# Patient Record
Sex: Female | Born: 1980 | Race: Black or African American | Hispanic: No | Marital: Single | Smoking: Former smoker
Health system: Southern US, Community
[De-identification: ages and names within clinical notes are randomized; demographics above are authoritative.]

## PROBLEM LIST (undated history)

## (undated) DIAGNOSIS — I10 Essential (primary) hypertension: Secondary | ICD-10-CM

---

## 2002-06-16 ENCOUNTER — Ambulatory Visit (HOSPITAL_COMMUNITY): Admission: RE | Admit: 2002-06-16 | Discharge: 2002-06-16 | Payer: Self-pay

## 2002-08-04 ENCOUNTER — Ambulatory Visit (HOSPITAL_COMMUNITY): Admission: RE | Admit: 2002-08-04 | Discharge: 2002-08-04 | Payer: Self-pay

## 2002-09-15 ENCOUNTER — Ambulatory Visit (HOSPITAL_COMMUNITY): Admission: RE | Admit: 2002-09-15 | Discharge: 2002-09-15 | Payer: Self-pay

## 2002-11-02 ENCOUNTER — Encounter (HOSPITAL_COMMUNITY): Admission: RE | Admit: 2002-11-02 | Discharge: 2002-11-02 | Payer: Self-pay

## 2002-11-11 ENCOUNTER — Encounter: Admission: RE | Admit: 2002-11-11 | Discharge: 2002-11-11 | Payer: Self-pay

## 2002-11-11 ENCOUNTER — Inpatient Hospital Stay (HOSPITAL_COMMUNITY): Admission: AD | Admit: 2002-11-11 | Discharge: 2002-11-14 | Payer: Self-pay

## 2002-11-18 ENCOUNTER — Encounter: Admission: RE | Admit: 2002-11-18 | Discharge: 2002-11-18 | Payer: Self-pay | Admitting: Family Medicine

## 2002-11-25 ENCOUNTER — Encounter: Admission: RE | Admit: 2002-11-25 | Discharge: 2002-11-25 | Payer: Self-pay | Admitting: Family Medicine

## 2002-11-30 ENCOUNTER — Inpatient Hospital Stay (HOSPITAL_COMMUNITY): Admission: AD | Admit: 2002-11-30 | Discharge: 2002-12-03 | Payer: Self-pay

## 2002-11-30 ENCOUNTER — Encounter: Admission: RE | Admit: 2002-11-30 | Discharge: 2002-11-30 | Payer: Self-pay

## 2004-11-18 ENCOUNTER — Emergency Department (HOSPITAL_COMMUNITY): Admission: EM | Admit: 2004-11-18 | Discharge: 2004-11-18 | Payer: Self-pay | Admitting: Emergency Medicine

## 2005-01-31 ENCOUNTER — Emergency Department (HOSPITAL_COMMUNITY): Admission: EM | Admit: 2005-01-31 | Discharge: 2005-01-31 | Payer: Self-pay | Admitting: Emergency Medicine

## 2005-07-17 ENCOUNTER — Emergency Department (HOSPITAL_COMMUNITY): Admission: EM | Admit: 2005-07-17 | Discharge: 2005-07-17 | Payer: Self-pay | Admitting: Emergency Medicine

## 2007-07-09 ENCOUNTER — Emergency Department (HOSPITAL_COMMUNITY): Admission: EM | Admit: 2007-07-09 | Discharge: 2007-07-09 | Payer: Self-pay | Admitting: Emergency Medicine

## 2007-10-23 ENCOUNTER — Emergency Department (HOSPITAL_COMMUNITY): Admission: EM | Admit: 2007-10-23 | Discharge: 2007-10-23 | Payer: Self-pay | Admitting: Emergency Medicine

## 2007-10-23 ENCOUNTER — Encounter: Admission: RE | Admit: 2007-10-23 | Discharge: 2007-10-23 | Payer: Self-pay | Admitting: Family Medicine

## 2010-07-29 ENCOUNTER — Encounter: Payer: Self-pay | Admitting: Family Medicine

## 2010-11-23 NOTE — Discharge Summary (Signed)
NAME:  Christie Rojas, Christie Rojas                         ACCOUNT NO.:  [id number]   MEDICAL RECORD NO.:  [id number]                   PATIENT TYPE:  INP   LOCATION:  9159                                 FACILITY:  [facility]   PHYSICIAN:  Georgina Peer, M.D.              DATE OF BIRTH:  06-Oct-1980   DATE OF ADMISSION:  11/11/2002  DATE OF DISCHARGE:  11/14/2002                                 DISCHARGE SUMMARY   ADMISSION DIAGNOSIS:  Hypertension with scotomata to rule out preeclampsia.   DISCHARGE DIAGNOSIS:  Pregnancy-induced hypertension stable on bedrest.  No  evidence of preeclampsia.   BRIEF HISTORY:  The patient is a 30 year old black female gravida 1, para 0.  She is 36 weeks, admitted with elevated blood pressures by Ronda Fairly. Galen Daft,  M.D.  She also had some scotomata.  The patient was brought in for an  evaluation, 24-hour urine, and assessment for preeclampsia.   HOSPITAL COURSE:  During the hospital course, she is placed on strict bed  rest and blood pressures were followed closely.  She is placed on magnesium  sulfate 2 grams an hour.  The patient had an ultrasound which showed 25th to  50th percentile with a normal amniotic fluid index and biophysical profiles  which were 8 out of 8 on both May 5th and May 7th.  Magnesium sulfate was  continued and a 24-hour urine was obtained.  The creatinine clearance was 77  mL per minute and the total protein was 37 mg per 24 hours.  She had no  edema.  Her diastolics were running 70s to 95 and on May 8th her magnesium  sulfate was stopped.  She had some contractions which were not all  noticeable.  Fetal heart rate was reactive.  She denies headache or blurred  vision.  Diastolics on May 8th were 90-100 on occasion but on May 9th were  all under 90.  She had no headache or blurred vision.  Her reflexes were  normal.  Her abdomen was soft and nontender.  She had no epigastric pain.  Her PIH labs were normal.   DISPOSITION:  She was  discharged home on bed rest to follow up with Ronda Fairly. Tuso, M.D. within 1-2 days.   DISCHARGE INSTRUCTIONS:  She was given strict instructions to follow bed  rest, to watch for headache, blurred vision, scotomata, epigastric pain,  decreased fetal movement, vaginal bleeding or labor symptoms.  She  understands these and acknowledge those.   DISCHARGE MEDICATIONS:  She was sent home on prenatal vitamins.                                               Georgina Peer, M.D.    JPN/MEDQ  D:  11/14/2002  T:  11/14/2002  Job:  [id number]   cc:   Ronda Fairly. Galen Daft, M.D.  [address], [address]  [place]  [place] [address]  Fax: [phone]   [facility]

## 2011-04-02 LAB — POCT I-STAT, CHEM 8
BUN: 15
Creatinine, Ser: 1
HCT: 42
Potassium: 3.7
Sodium: 136

## 2011-04-02 LAB — POCT CARDIAC MARKERS: Operator id: 294501

## 2011-10-03 ENCOUNTER — Emergency Department (INDEPENDENT_AMBULATORY_CARE_PROVIDER_SITE_OTHER): Payer: Self-pay

## 2011-10-03 ENCOUNTER — Encounter (HOSPITAL_COMMUNITY): Payer: Self-pay | Admitting: *Deleted

## 2011-10-03 ENCOUNTER — Emergency Department (INDEPENDENT_AMBULATORY_CARE_PROVIDER_SITE_OTHER)
Admission: EM | Admit: 2011-10-03 | Discharge: 2011-10-03 | Disposition: A | Discharge status: Home / Self Care | Payer: Self-pay | Source: Home / Self Care | Attending: Family Medicine | Admitting: Family Medicine

## 2011-10-03 DIAGNOSIS — J45909 Unspecified asthma, uncomplicated: Secondary | ICD-10-CM

## 2011-10-03 DIAGNOSIS — J45901 Unspecified asthma with (acute) exacerbation: Secondary | ICD-10-CM

## 2011-10-03 HISTORY — DX: Essential (primary) hypertension: I10

## 2011-10-03 MED ORDER — AZITHROMYCIN 250 MG PO TABS
ORAL_TABLET | ORAL | Status: AC
  Filled 2011-10-03: qty 2

## 2011-10-03 MED ORDER — ALBUTEROL SULFATE (5 MG/ML) 0.5% IN NEBU
INHALATION_SOLUTION | RESPIRATORY_TRACT | Status: AC
  Filled 2011-10-03: qty 1

## 2011-10-03 MED ORDER — ALBUTEROL SULFATE HFA 108 (90 BASE) MCG/ACT IN AERS: 2.0000 | INHALATION_SPRAY | Freq: Four times a day (QID) | RESPIRATORY_TRACT | Status: DC | PRN

## 2011-10-03 MED ORDER — IPRATROPIUM BROMIDE 0.02 % IN SOLN
0.5000 mg | Freq: Once | RESPIRATORY_TRACT | Status: AC
  Administered 2011-10-03: 0.5 mg via RESPIRATORY_TRACT

## 2011-10-03 MED ORDER — ALBUTEROL SULFATE (5 MG/ML) 0.5% IN NEBU
5.0000 mg | INHALATION_SOLUTION | Freq: Once | RESPIRATORY_TRACT | Status: AC
  Administered 2011-10-03: 5 mg via RESPIRATORY_TRACT

## 2011-10-03 MED ORDER — AZITHROMYCIN 250 MG PO TABS
500.0000 mg | ORAL_TABLET | Freq: Once | ORAL | Status: AC
  Administered 2011-10-03: 500 mg via ORAL

## 2011-10-03 MED ORDER — AZITHROMYCIN 250 MG PO TABS: ORAL_TABLET | ORAL | Status: AC

## 2011-10-03 MED ORDER — ACETAMINOPHEN 325 MG PO TABS
650.0000 mg | ORAL_TABLET | Freq: Once | ORAL | Status: AC
  Administered 2011-10-03: 650 mg via ORAL

## 2011-10-03 MED ORDER — ACETAMINOPHEN 325 MG PO TABS
ORAL_TABLET | ORAL | Status: AC
  Filled 2011-10-03: qty 2

## 2011-10-03 NOTE — ED Notes (Signed)
Pt  Reports  Symptoms  Of  fver  Cough   Congestion    And  Shortness of  Breath    X  2  Days   She  Is  A  Smoker    As well  sjhe  Also  Has  htn  And  Is  Non  Compliant          She  Is  Awake  And  Alert  In mod  Distress  She  Is  Masked  And  Is  In a  Private  Room

## 2011-10-03 NOTE — ED Provider Notes (Signed)
History     CSN: [id number]  Arrival date & time 10/03/11  1956   First MD Initiated Contact with Patient 10/03/11 2022      Chief Complaint  Patient presents with  . Fever    (Consider location/radiation/quality/duration/timing/severity/associated sxs/prior treatment) Patient is a 31 y.o. female presenting with fever. The history is provided by the patient.  Fever Primary symptoms of the febrile illness include fever, cough, wheezing and shortness of breath. Primary symptoms do not include nausea or vomiting. The current episode started 2 days ago. This is a new problem. The problem has been gradually worsening.  Risk factors: smoker.   Past Medical History  Diagnosis Date  . Hypertension     History reviewed. No pertinent past surgical history.  No family history on file.  History  Substance Use Topics  . Smoking status: Current Everyday Smoker  . Smokeless tobacco: Not on file  . Alcohol Use: Yes    OB History    Grav Para Term Preterm Abortions TAB SAB Ect Mult Living                  Review of Systems  Constitutional: Positive for fever.  HENT: Positive for congestion, rhinorrhea and postnasal drip. Negative for sore throat.   Respiratory: Positive for cough, shortness of breath and wheezing.   Gastrointestinal: Negative for nausea and vomiting.    Allergies  Review of patient's allergies indicates not on file.  Home Medications   Current Outpatient Rx  Name Route Sig Dispense Refill  . ALBUTEROL SULFATE HFA 108 (90 BASE) MCG/ACT IN AERS Inhalation Inhale 2 puffs into the lungs every 6 (six) hours as needed for wheezing. 1 Inhaler 0  . AZITHROMYCIN 250 MG PO TABS  Take as directed on pack, start on fri. 6 each 0    BP 198/112  Pulse 110  Temp(Src) 102.1 F (38.9 C) (Oral)  Resp 22  SpO2 95%  LMP 09/19/2011  Physical Exam  Nursing note and vitals reviewed. Constitutional: She is oriented to person, place, and time. She appears well-developed  and well-nourished.  HENT:  Head: Normocephalic.  Right Ear: External ear normal.  Left Ear: External ear normal.  Nose: Mucosal edema and rhinorrhea present.  Mouth/Throat: Oropharynx is clear and moist.  Eyes: Pupils are equal, round, and reactive to light.  Neck: Normal range of motion. Neck supple.  Cardiovascular: Normal rate, regular rhythm, normal heart sounds and intact distal pulses.   Pulmonary/Chest: Effort normal. She has wheezes. She has rales.  Lymphadenopathy:    She has no cervical adenopathy.  Neurological: She is alert and oriented to person, place, and time.  Skin: Skin is warm and dry.  Psychiatric: She has a normal mood and affect.    ED Course  Procedures (including critical care time)  Labs Reviewed - No data to display No results found.   1. Acute asthmatic bronchitis       MDM  X-rays reviewed and report per radiologist.         Linna Hoff, MD 10/03/11 2201

## 2011-10-03 NOTE — Discharge Instructions (Signed)
 Take all of medicine, drink lots of fluids, no more smoking, see your doctor if further problems

## 2013-09-15 ENCOUNTER — Emergency Department (HOSPITAL_COMMUNITY)
Admission: EM | Admit: 2013-09-15 | Discharge: 2013-09-15 | Discharge status: Against Medical Advice | Payer: Self-pay | Attending: Emergency Medicine | Admitting: Emergency Medicine

## 2013-09-15 ENCOUNTER — Encounter (HOSPITAL_COMMUNITY): Payer: Self-pay | Admitting: Emergency Medicine

## 2013-09-15 DIAGNOSIS — I10 Essential (primary) hypertension: Secondary | ICD-10-CM | POA: Insufficient documentation

## 2013-09-15 DIAGNOSIS — F17200 Nicotine dependence, unspecified, uncomplicated: Secondary | ICD-10-CM | POA: Insufficient documentation

## 2013-09-15 DIAGNOSIS — M25539 Pain in unspecified wrist: Secondary | ICD-10-CM | POA: Insufficient documentation

## 2013-09-15 NOTE — ED Notes (Signed)
Pt in c/o right wrist pain, unknown injury but states pain is worse with movement and that it feels swollen at times, pt also concerned that her BP has been elevated because she has been out of her medication x1 month

## 2014-11-29 ENCOUNTER — Encounter (HOSPITAL_COMMUNITY): Payer: Self-pay | Admitting: Emergency Medicine

## 2014-11-29 ENCOUNTER — Emergency Department (HOSPITAL_COMMUNITY)
Admission: EM | Admit: 2014-11-29 | Discharge: 2014-11-29 | Discharge status: Against Medical Advice | Payer: Self-pay | Attending: Emergency Medicine | Admitting: Emergency Medicine

## 2014-11-29 DIAGNOSIS — R109 Unspecified abdominal pain: Secondary | ICD-10-CM | POA: Insufficient documentation

## 2014-11-29 DIAGNOSIS — Z720 Tobacco use: Secondary | ICD-10-CM | POA: Insufficient documentation

## 2014-11-29 DIAGNOSIS — R112 Nausea with vomiting, unspecified: Secondary | ICD-10-CM | POA: Insufficient documentation

## 2014-11-29 DIAGNOSIS — I10 Essential (primary) hypertension: Secondary | ICD-10-CM | POA: Insufficient documentation

## 2014-11-29 LAB — COMPREHENSIVE METABOLIC PANEL
ALBUMIN: 4.4 g/dL (ref 3.5–5.0)
ALT: 14 U/L (ref 14–54)
ANION GAP: 13 (ref 5–15)
AST: 17 U/L (ref 15–41)
Alkaline Phosphatase: 54 U/L (ref 38–126)
BUN: 10 mg/dL (ref 6–20)
CALCIUM: 9 mg/dL (ref 8.9–10.3)
CHLORIDE: 106 mmol/L (ref 101–111)
CO2: 21 mmol/L — AB (ref 22–32)
CREATININE: 0.93 mg/dL (ref 0.44–1.00)
GFR calc Af Amer: >60 mL/min (ref >60)
GFR calc non Af Amer: >60 mL/min (ref >60)
GLUCOSE: 117 mg/dL — AB (ref 65–99)
POTASSIUM: 2.4 mmol/L — AB (ref 3.5–5.1)
SODIUM: 140 mmol/L (ref 135–145)
TOTAL PROTEIN: 7.3 g/dL (ref 6.5–8.1)
Total Bilirubin: 0.1 mg/dL — ABNORMAL LOW (ref 0.3–1.2)

## 2014-11-29 LAB — CBC WITH DIFFERENTIAL/PLATELET
BASOS ABS: 0 10*3/uL (ref 0.0–0.1)
BASOS PCT: 0 % (ref 0–1)
EOS ABS: 0.1 10*3/uL (ref 0.0–0.7)
Eosinophils Relative: 1 % (ref 0–5)
HEMATOCRIT: 35.8 % — AB (ref 36.0–46.0)
Hemoglobin: 11.9 g/dL — ABNORMAL LOW (ref 12.0–15.0)
LYMPHS ABS: 6.4 10*3/uL — AB (ref 0.7–4.0)
Lymphocytes Relative: 75 % — ABNORMAL HIGH (ref 12–46)
MCH: 28 pg (ref 26.0–34.0)
MCHC: 33.2 g/dL (ref 30.0–36.0)
MCV: 84.2 fL (ref 78.0–100.0)
MONO ABS: 0.3 10*3/uL (ref 0.1–1.0)
Monocytes Relative: 3 % (ref 3–12)
NEUTROS ABS: 1.8 10*3/uL (ref 1.7–7.7)
NEUTROS PCT: 21 % — AB (ref 43–77)
Platelets: 286 10*3/uL (ref 150–400)
RBC: 4.25 MIL/uL (ref 3.87–5.11)
RDW: 14.3 % (ref 11.5–15.5)
WBC: 8.6 10*3/uL (ref 4.0–10.5)

## 2014-11-29 LAB — LIPASE, BLOOD: LIPASE: 17 U/L — AB (ref 22–51)

## 2014-11-29 NOTE — ED Notes (Signed)
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  She is complainig of abdominal pain, nausea,  and  vomiting.  She rates her pain 10/10.  The patient does have a history of hypertension but she could not advise if she has taken her BP medication today.

## 2014-11-30 LAB — PATHOLOGIST SMEAR REVIEW

## 2015-09-29 ENCOUNTER — Emergency Department (HOSPITAL_COMMUNITY)
Admission: EM | Admit: 2015-09-29 | Discharge: 2015-09-29 | Disposition: A | Discharge status: Home / Self Care | Payer: Self-pay | Attending: Emergency Medicine | Admitting: Emergency Medicine

## 2015-09-29 ENCOUNTER — Emergency Department (HOSPITAL_COMMUNITY): Payer: Self-pay

## 2015-09-29 ENCOUNTER — Encounter (HOSPITAL_COMMUNITY): Payer: Self-pay | Admitting: *Deleted

## 2015-09-29 DIAGNOSIS — R202 Paresthesia of skin: Secondary | ICD-10-CM | POA: Insufficient documentation

## 2015-09-29 DIAGNOSIS — R51 Headache: Secondary | ICD-10-CM

## 2015-09-29 DIAGNOSIS — Z880 Allergy status to penicillin: Secondary | ICD-10-CM | POA: Insufficient documentation

## 2015-09-29 DIAGNOSIS — I10 Essential (primary) hypertension: Secondary | ICD-10-CM | POA: Insufficient documentation

## 2015-09-29 DIAGNOSIS — F17200 Nicotine dependence, unspecified, uncomplicated: Secondary | ICD-10-CM | POA: Insufficient documentation

## 2015-09-29 DIAGNOSIS — R519 Headache, unspecified: Secondary | ICD-10-CM

## 2015-09-29 DIAGNOSIS — J0100 Acute maxillary sinusitis, unspecified: Secondary | ICD-10-CM | POA: Insufficient documentation

## 2015-09-29 LAB — CBC
HCT: 37.3 % (ref 36.0–46.0)
HEMOGLOBIN: 12.1 g/dL (ref 12.0–15.0)
MCH: 26.8 pg (ref 26.0–34.0)
MCHC: 32.4 g/dL (ref 30.0–36.0)
MCV: 82.5 fL (ref 78.0–100.0)
Platelets: 330 10*3/uL (ref 150–400)
RBC: 4.52 MIL/uL (ref 3.87–5.11)
RDW: 15 % (ref 11.5–15.5)
WBC: 5.8 10*3/uL (ref 4.0–10.5)

## 2015-09-29 LAB — I-STAT TROPONIN, ED: Troponin i, poc: 0 ng/mL (ref 0.00–0.08)

## 2015-09-29 LAB — BASIC METABOLIC PANEL
ANION GAP: 10 (ref 5–15)
BUN: 10 mg/dL (ref 6–20)
CALCIUM: 9.6 mg/dL (ref 8.9–10.3)
CHLORIDE: 103 mmol/L (ref 101–111)
CO2: 25 mmol/L (ref 22–32)
Creatinine, Ser: 0.9 mg/dL (ref 0.44–1.00)
GFR calc non Af Amer: >60 mL/min (ref >60)
GLUCOSE: 77 mg/dL (ref 65–99)
Potassium: 3.6 mmol/L (ref 3.5–5.1)
Sodium: 138 mmol/L (ref 135–145)

## 2015-09-29 MED ORDER — ALBUTEROL SULFATE HFA 108 (90 BASE) MCG/ACT IN AERS
2.0000 | INHALATION_SPRAY | RESPIRATORY_TRACT | Status: DC | PRN
  Administered 2015-09-29: 2 via RESPIRATORY_TRACT
  Filled 2015-09-29: qty 6.7

## 2015-09-29 MED ORDER — AEROCHAMBER PLUS FLO-VU MEDIUM MISC
1.0000 | Freq: Once | Status: AC
  Administered 2015-09-29: 1
  Filled 2015-09-29: qty 1

## 2015-09-29 MED ORDER — HYDROCHLOROTHIAZIDE 25 MG PO TABS
25.0000 mg | ORAL_TABLET | Freq: Every day | ORAL | Status: DC
  Administered 2015-09-29: 25 mg via ORAL
  Filled 2015-09-29: qty 1

## 2015-09-29 MED ORDER — IBUPROFEN 800 MG PO TABS
800.0000 mg | ORAL_TABLET | Freq: Once | ORAL | Status: AC
  Administered 2015-09-29: 800 mg via ORAL
  Filled 2015-09-29: qty 1

## 2015-09-29 MED ORDER — DOXYCYCLINE HYCLATE 100 MG PO CAPS: 100.0000 mg | ORAL_CAPSULE | Freq: Two times a day (BID) | ORAL | Status: AC

## 2015-09-29 MED ORDER — HYDROCHLOROTHIAZIDE 12.5 MG PO TABS: 12.5000 mg | ORAL_TABLET | Freq: Every day | ORAL | Status: AC

## 2015-09-29 MED ORDER — DOXYCYCLINE HYCLATE 100 MG PO TABS
100.0000 mg | ORAL_TABLET | Freq: Once | ORAL | Status: AC
  Administered 2015-09-29: 100 mg via ORAL
  Filled 2015-09-29: qty 1

## 2015-09-29 NOTE — ED Notes (Signed)
Pt reports hx of htn, has been out of her meds for years. Pt is here today due to HTN and headache. Reports intermittent blurred vision and tingling to left fingers x 1 days. Hypertensive at triage.

## 2015-09-29 NOTE — Discharge Instructions (Signed)
1. Medications: doxycycline, HCTZ, usual home medications 2. Treatment: rest, drink plenty of fluids,  3. Follow Up: Please followup with your primary doctor in 7-10 days for discussion of your diagnoses and further evaluation after today's visit; if you do not have a primary care doctor use the resource guide provided to find one; Please return to the ER for worsening headache, persistent symptoms or other concerns

## 2015-09-29 NOTE — ED Notes (Signed)
PA at bedside.

## 2015-09-29 NOTE — ED Provider Notes (Signed)
CSN: [id number]     Arrival date & time 09/29/15  1439 History   First MD Initiated Contact with Patient 09/29/15 1735     Chief Complaint  Patient presents with  . Hypertension  . Headache     (Consider location/radiation/quality/duration/timing/severity/associated sxs/prior Treatment) The history is provided by the patient and medical records. No language interpreter was used.     Christie Rojas is a 35 y.o. female  with a hx of HTN (untreated in the last 2-3 years) presents to the Emergency Department complaining of gradual, persistent, progressively worsening headache at the bilateral temples onset yesterday at work.  Pt reports she was not undergoing any strenuous activity when it started.  Pt with associated bilateral upper arm paresthesias that comes in waves and intermittent blurred vision.  She reports she as previously taking Norvasc but does not know the dosage.  She reports that last night her BP was 204/144.  Patient also reports associated congestion, postnasal drip and frontal headaches for approximately 4 days. She denies fevers or chills, nausea or vomiting, neck pain, neck stiffness, chest pain, shortness of breath, swelling of her legs, syncope, dysuria, hematuria.  Past Medical History  Diagnosis Date  . Hypertension    History reviewed. No pertinent past surgical history. History reviewed. No pertinent family history. Social History  Substance Use Topics  . Smoking status: Current Every Day Smoker  . Smokeless tobacco: Never Used  . Alcohol Use: Yes   OB History    No data available     Review of Systems  Constitutional: Negative for fever, diaphoresis, appetite change, fatigue and unexpected weight change.  HENT: Positive for congestion, postnasal drip and sinus pressure. Negative for mouth sores.   Eyes: Negative for visual disturbance.  Respiratory: Negative for cough, chest tightness, shortness of breath and wheezing.   Cardiovascular: Negative for chest  pain.  Gastrointestinal: Negative for nausea, vomiting, abdominal pain, diarrhea and constipation.  Endocrine: Negative for polydipsia, polyphagia and polyuria.  Genitourinary: Negative for dysuria, urgency, frequency and hematuria.  Musculoskeletal: Negative for back pain and neck stiffness.  Skin: Negative for rash.  Allergic/Immunologic: Negative for immunocompromised state.  Neurological: Positive for headaches (frontal). Negative for syncope and light-headedness.  Hematological: Does not bruise/bleed easily.  Psychiatric/Behavioral: Negative for sleep disturbance. The patient is not nervous/anxious.       Allergies  Penicillins and Shellfish allergy  Home Medications   Prior to Admission medications   Medication Sig Start Date End Date Taking? Authorizing Provider  ibuprofen (ADVIL,MOTRIN) 200 MG tablet Take 400 mg by mouth every 6 (six) hours as needed for moderate pain.   Yes Historical Provider, MD  doxycycline (VIBRAMYCIN) 100 MG capsule Take 1 capsule (100 mg total) by mouth 2 (two) times daily. 09/29/15   Reyce Lubeck, PA-C  hydrochlorothiazide (HYDRODIURIL) 12.5 MG tablet Take 1 tablet (12.5 mg total) by mouth daily. 09/29/15   Maleiya Pergola, PA-C   BP 186/127 mmHg  Pulse 84  Temp(Src) 98.1 F (36.7 C) (Oral)  Resp 20  SpO2 100%  LMP 09/29/2015 Physical Exam  Constitutional: She is oriented to person, place, and time. She appears well-developed and well-nourished. No distress.  HENT:  Head: Normocephalic and atraumatic.  Right Ear: Tympanic membrane, external ear and ear canal normal.  Left Ear: Tympanic membrane, external ear and ear canal normal.  Nose: Nose normal. No epistaxis. Right sinus exhibits no maxillary sinus tenderness and no frontal sinus tenderness. Left sinus exhibits no maxillary sinus tenderness and no frontal  sinus tenderness.  Mouth/Throat: Uvula is midline, oropharynx is clear and moist and mucous membranes are normal. Mucous  membranes are not pale and not cyanotic. No oropharyngeal exudate, posterior oropharyngeal edema, posterior oropharyngeal erythema or tonsillar abscesses.  Eyes: Conjunctivae and EOM are normal. Pupils are equal, round, and reactive to light. No scleral icterus.  No horizontal, vertical or rotational nystagmus  Neck: Normal range of motion and full passive range of motion without pain. Neck supple.  Full active and passive ROM without pain No midline or paraspinal tenderness No nuchal rigidity or meningeal signs  Cardiovascular: Normal rate, regular rhythm, normal heart sounds and intact distal pulses.   Pulmonary/Chest: Effort normal. No stridor. No respiratory distress. She has wheezes. She has no rales.  Wheezes noted throughout  Abdominal: Soft. Bowel sounds are normal. There is no tenderness. There is no rebound and no guarding.  Musculoskeletal: Normal range of motion.  Lymphadenopathy:    She has no cervical adenopathy.  Neurological: She is alert and oriented to person, place, and time. She has normal reflexes. No cranial nerve deficit. She exhibits normal muscle tone. Coordination normal.  Mental Status:  Alert, oriented, thought content appropriate. Speech fluent without evidence of aphasia. Able to follow 2 step commands without difficulty.  Cranial Nerves:  II:  Peripheral visual fields grossly normal, pupils equal, round, reactive to light III,IV, VI: ptosis not present, extra-ocular motions intact bilaterally  V,VII: smile symmetric, facial light touch sensation equal VIII: hearing grossly normal bilaterally  IX,X: midline uvula rise  XI: bilateral shoulder shrug equal and strong XII: midline tongue extension  Motor:  5/5 in upper and lower extremities bilaterally including strong and equal grip strength and dorsiflexion/plantar flexion Sensory: Pinprick and light touch normal in all extremities.  Deep Tendon Reflexes: 2+ and symmetric  Cerebellar: normal finger-to-nose  with bilateral upper extremities Gait: normal gait and balance CV: distal pulses palpable throughout   Skin: Skin is warm and dry. No rash noted. She is not diaphoretic.  Psychiatric: She has a normal mood and affect. Her behavior is normal. Judgment and thought content normal.  Nursing note and vitals reviewed.   ED Course  Procedures (including critical care time) Labs Review Labs Reviewed  BASIC METABOLIC PANEL  CBC  I-STAT TROPOININ, ED    Imaging Review Ct Head Wo Contrast  09/29/2015  CLINICAL DATA:  Hypertensive emergency.  Elevated blood pressure. EXAM: CT HEAD WITHOUT CONTRAST TECHNIQUE: Contiguous axial images were obtained from the base of the skull through the vertex without intravenous contrast. COMPARISON:  07/09/2007. FINDINGS: No evidence for acute infarction, hemorrhage, mass lesion, hydrocephalus, or extra-axial fluid. No atrophy or white matter disease. Intact calvarium. Foamy secretions and slight layering fluid in the BILATERAL maxillary sinuses suggesting acuity. No mastoid fluid. IMPRESSION: No acute intracranial findings. No change from prior normal study. No findings are observed diagnostic for PRES. Suspected BILATERAL acute maxillary sinusitis. Electronically Signed   By: Elsie Stain M.D.   On: 09/29/2015 18:18   I have personally reviewed and evaluated these images and lab results as part of my medical decision-making.   EKG Interpretation   Date/Time:  Friday September 29 2015 18:45:56 EDT Ventricular Rate:  72 PR Interval:  145 QRS Duration: 77 QT Interval:  399 QTC Calculation: 437 R Axis:   78 Text Interpretation:  Sinus rhythm LVH by voltage Borderline T  abnormalities, diffuse leads Confirmed by Rubin Payor  MD, NATHAN [phone]) on  09/29/2015 7:46:03 PM      MDM  Final diagnoses:  Essential hypertension  Nonintractable headache, unspecified chronicity pattern, unspecified headache type  Acute maxillary sinusitis, recurrence not specified    Christie Rojas presents with hypertension, headache.  Vision with mild, generalized wheezing. She is a smoker. Albuterol given complete resolution. Labs are reassuring. Negative troponin. Head CT shows acute maxillary sinusitis but no evidence for acute infarction, hemorrhage, hydrocephalus or lesion.    Patient noted to be hypertensive in the emergency department.  No signs of hypertensive urgency.  Discussed with patient the need for close follow-up and management by their primary care physician. She was given hydrochlorothiazide here in the emergency department with a decrease in her blood pressure. We will discharge home with this medication until she can see her primary care physician.  Discharge, doxycycline for her acute sinusitis as she has a penicillin allergy. Headache, tingling sensation and blurred vision have resolved at this time.  She is a normal neurologic examination ambulate without difficulty urinating emergency department.     Dahlia ClientHannah Jamicheal Heard, PA-C 09/29/15 1946  Benjiman CoreNathan Pickering, MD 09/29/15 [phone]

## 2016-06-14 IMAGING — CT CT HEAD W/O CM
2 series · 15 of 30 positions shown, 17 images · non-contrast
Comparison: 07/09/2007.

CLINICAL DATA: Hypertensive emergency.  Elevated blood pressure.

EXAM:
CT HEAD WITHOUT CONTRAST
TECHNIQUE: Contiguous axial images were obtained from the base of the skull
through the vertex without intravenous contrast.

[Series 2: head without · axial · non-contrast · 0.41mm/px · z∈[-105,+10]mm · 7 of 31 slices shown, 9 images]
[im 4/31  brain]
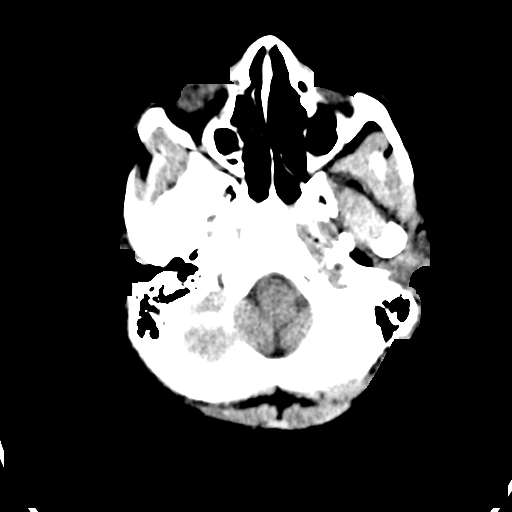
[im 4/31  bone]
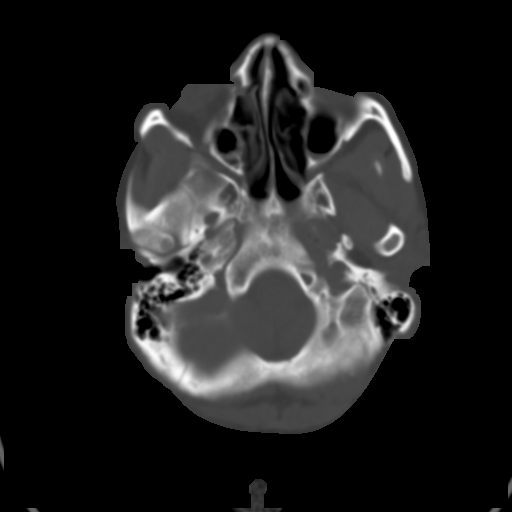
[im 8/31  brain]
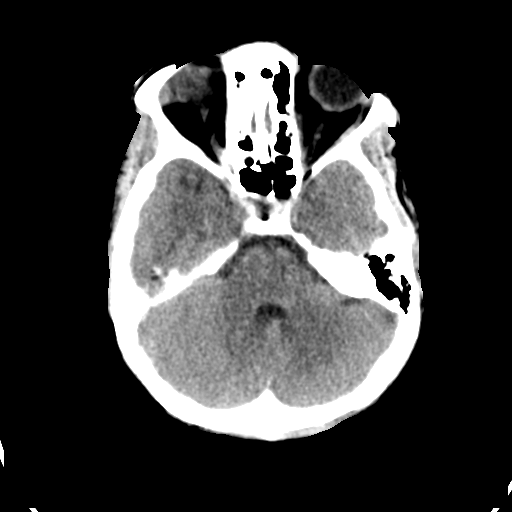
[im 12/31  brain]
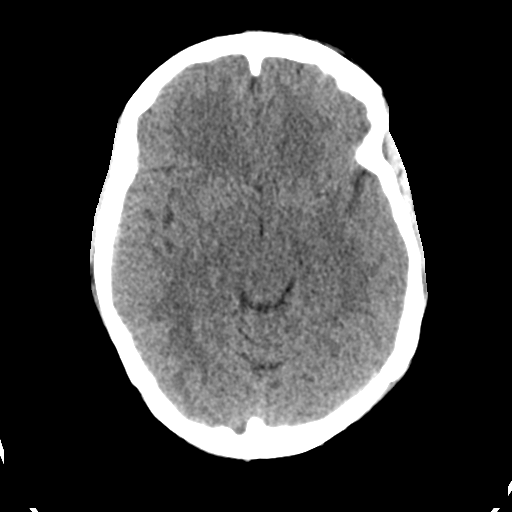
[im 16/31  brain]
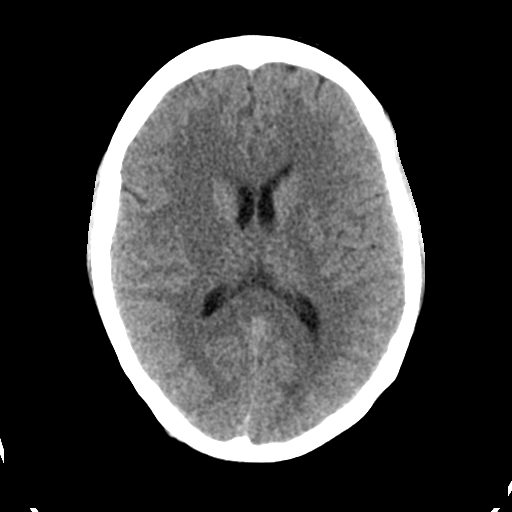
[im 19/31  brain]
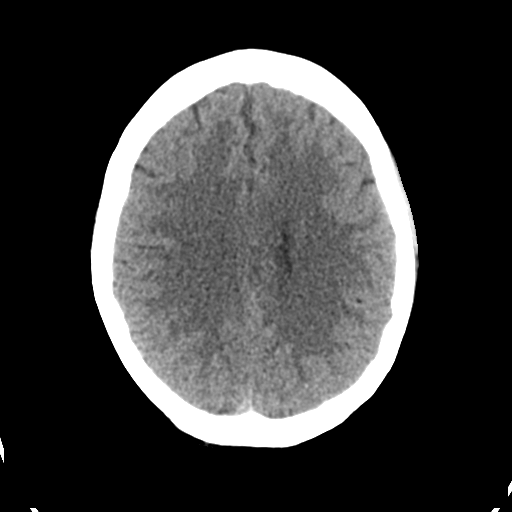
[im 19/31  bone]
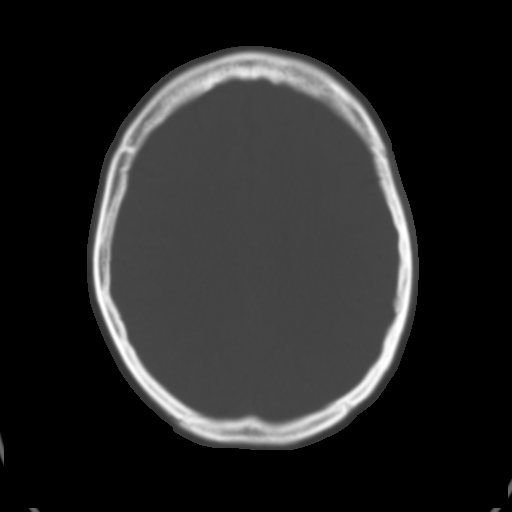
[im 23/31  brain]
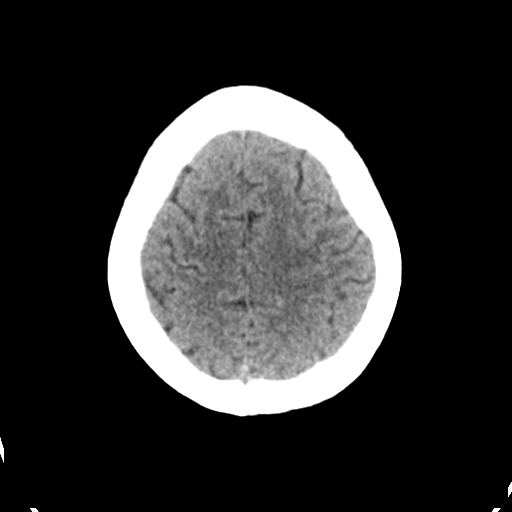
[im 27/31  brain]
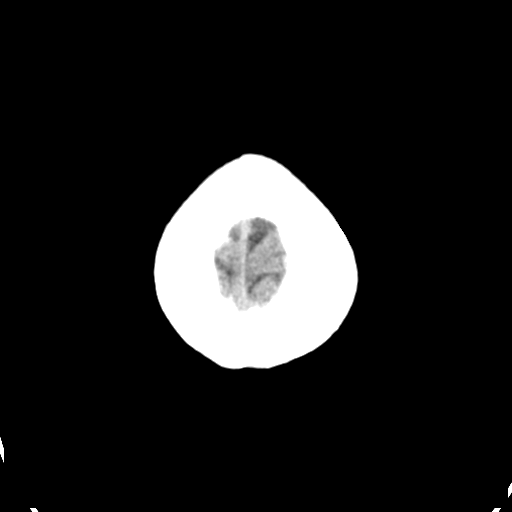

[Series 3: head bone · axial · 0.41mm/px · z∈[-106,+18]mm · 8 of 78 slices shown]
[im 8/78  bone]
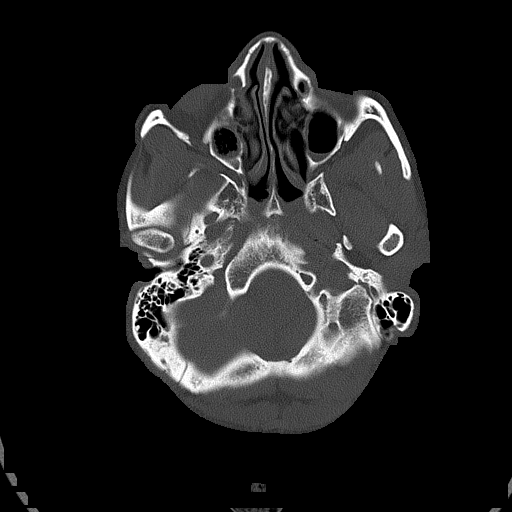
[im 16/78  bone]
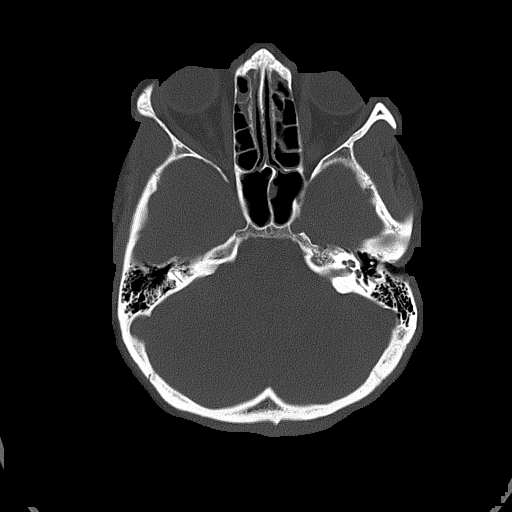
[im 24/78  bone]
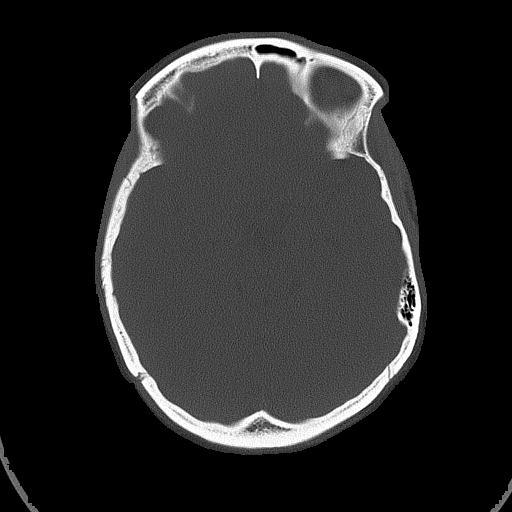
[im 35/78  bone]
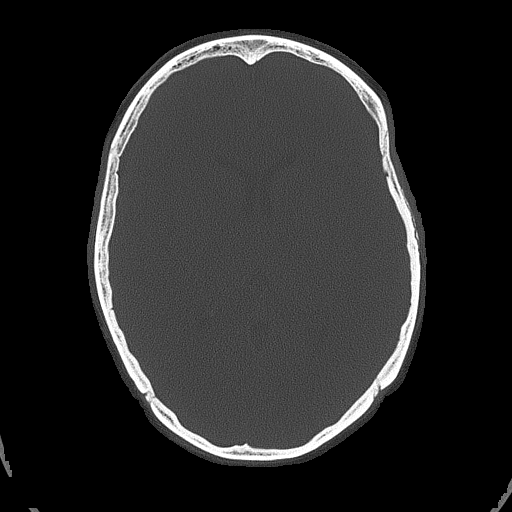
[im 43/78  bone]
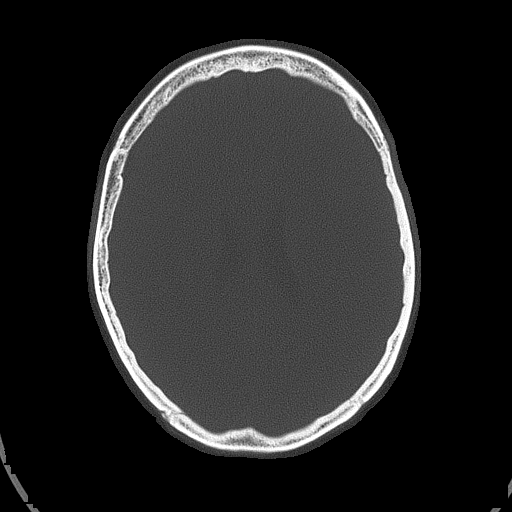
[im 54/78  bone]
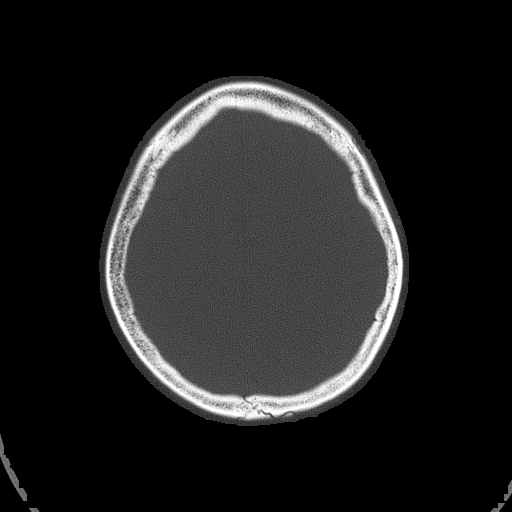
[im 62/78  bone]
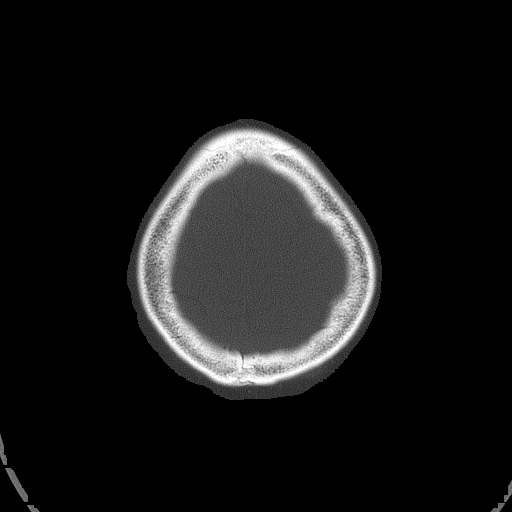
[im 70/78  bone]
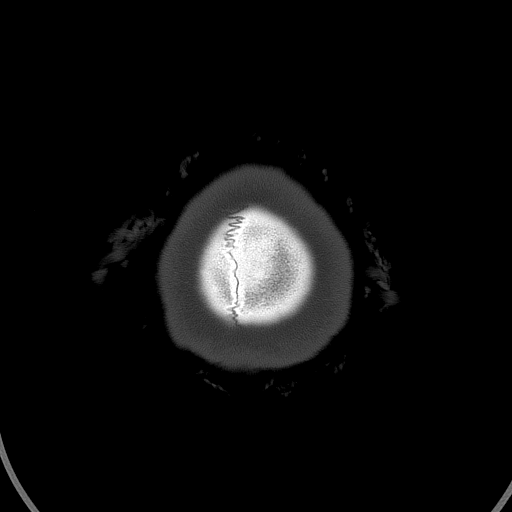

[15 of 30 positions shown; findings below may reference images not displayed]

FINDINGS: No evidence for acute infarction, hemorrhage, mass lesion,
hydrocephalus, or extra-axial fluid. No atrophy or white matter
disease. Intact calvarium. Foamy secretions and slight layering
fluid in the BILATERAL maxillary sinuses suggesting acuity. No
mastoid fluid.
IMPRESSION: No acute intracranial findings. No change from prior normal study.
No findings are observed diagnostic for PRES.

Suspected BILATERAL acute maxillary sinusitis.

## 2017-04-04 ENCOUNTER — Ambulatory Visit (HOSPITAL_COMMUNITY)
Admission: EM | Admit: 2017-04-04 | Discharge: 2017-04-04 | Disposition: A | Discharge status: Home / Self Care | Payer: 59 | Attending: Internal Medicine | Admitting: Internal Medicine

## 2017-04-04 ENCOUNTER — Encounter (HOSPITAL_COMMUNITY): Payer: Self-pay | Admitting: Family Medicine

## 2017-04-04 DIAGNOSIS — Z23 Encounter for immunization: Secondary | ICD-10-CM

## 2017-04-04 DIAGNOSIS — S61012A Laceration without foreign body of left thumb without damage to nail, initial encounter: Secondary | ICD-10-CM | POA: Diagnosis not present

## 2017-04-04 DIAGNOSIS — I10 Essential (primary) hypertension: Secondary | ICD-10-CM | POA: Diagnosis not present

## 2017-04-04 MED ORDER — LIDOCAINE-EPINEPHRINE (PF) 2 %-1:200000 IJ SOLN
INTRAMUSCULAR | Status: AC
  Filled 2017-04-04: qty 20

## 2017-04-04 MED ORDER — TETANUS-DIPHTHERIA TOXOIDS TD 5-2 LFU IM INJ: 0.5000 mL | INJECTION | Freq: Once | INTRAMUSCULAR | Status: DC

## 2017-04-04 MED ORDER — TETANUS-DIPHTH-ACELL PERTUSSIS 5-2.5-18.5 LF-MCG/0.5 IM SUSP
INTRAMUSCULAR | Status: AC
  Filled 2017-04-04: qty 0.5

## 2017-04-04 MED ORDER — TETANUS-DIPHTH-ACELL PERTUSSIS 5-2.5-18.5 LF-MCG/0.5 IM SUSP
0.5000 mL | Freq: Once | INTRAMUSCULAR | Status: AC
  Administered 2017-04-04: 0.5 mL via INTRAMUSCULAR

## 2017-04-04 NOTE — ED Triage Notes (Signed)
Pt here for laceration to left hand. sts she cut it at work today. Site still bleeding.

## 2017-04-04 NOTE — Discharge Instructions (Signed)
Nice to meet you. Sorry this happened. Keep dressing in place x 24 hours, then may take out and change. At 48 hours ok to get wet but do not submerge in water. Keep clean, dry and covered until you return in 1 week for  suture removal. May use tylenol ES as needed for pain. Would avoid medications like Advil or aleve when blood pressure is high. F/U with PCP for BP.

## 2017-04-04 NOTE — ED Provider Notes (Signed)
[facility]    CSN: [id number] Arrival date & time: 04/04/17  1206     History   Chief Complaint Chief Complaint  Patient presents with  . Laceration    HPI Christie Rojas is a 36 y.o. female.   36 yo presents with a left thumb laceration. She cut it at work today with a knife. (She works at [employer]). Difficulty with ongoing bleeding.       Past Medical History:  Diagnosis Date  . Hypertension     There are no active problems to display for this patient.   History reviewed. No pertinent surgical history.  OB History    No data available       Home Medications    Prior to Admission medications   Medication Sig Start Date End Date Taking? Authorizing Provider  lisinopril (PRINIVIL,ZESTRIL) 20 MG tablet Take 20 mg by mouth 2 (two) times daily.   Yes [provider]  metoprolol tartrate (LOPRESSOR) 25 MG tablet Take 12.5 mg by mouth 2 (two) times daily.   Yes [provider]  doxycycline (VIBRAMYCIN) 100 MG capsule Take 1 capsule (100 mg total) by mouth 2 (two) times daily. 09/29/15   Muthersbaugh, Dahlia Client, PA-C  hydrochlorothiazide (HYDRODIURIL) 12.5 MG tablet Take 1 tablet (12.5 mg total) by mouth daily. 09/29/15   Muthersbaugh, Dahlia Client, PA-C  ibuprofen (ADVIL,MOTRIN) 200 MG tablet Take 400 mg by mouth every 6 (six) hours as needed for moderate pain.    [provider]    Family History History reviewed. No pertinent family history.  Social History Social History  Substance Use Topics  . Smoking status: Current Every Day Smoker  . Smokeless tobacco: Never Used  . Alcohol use Yes     Allergies   Penicillins and Shellfish allergy   Review of Systems Review of Systems  All other systems reviewed and are negative.    Physical Exam Triage Vital Signs ED Triage Vitals  Enc Vitals Group     BP 04/04/17 1309 (!) 210/147     Pulse Rate 04/04/17 1309 93     Resp 04/04/17 1309 18     Temp --      Temp src --    SpO2 04/04/17 1309 100 %     Weight --      Height --      Head Circumference --      Peak Flow --      Pain Score 04/04/17 1307 10     Pain Loc --      Pain Edu? --      Excl. in GC? --    No data found.   Updated Vital Signs BP (!) 210/147   Pulse 93   Resp 18   SpO2 100%   Visual Acuity Right Eye Distance:   Left Eye Distance:   Bilateral Distance:    Right Eye Near:   Left Eye Near:    Bilateral Near:     Physical Exam  Constitutional: She is oriented to person, place, and time. She appears well-developed and well-nourished.  Musculoskeletal: Normal range of motion.  Full ROM in the left 1st IP, sensation intact  Neurological: She is alert and oriented to person, place, and time.  Skin: Skin is warm and dry.  Half moon superficial wound in the left thumb finger pad  Psychiatric: Her behavior is normal.  Nursing note and vitals reviewed.    UC Treatments / Results  Labs (all labs ordered are  listed, but only abnormal results are displayed) Labs Reviewed - No data to display  EKG  EKG Interpretation None       Radiology No results found.  Procedures .Marland KitchenLaceration Repair Date/Time: 04/04/2017 2:09 PM Performed by: Riki Sheer Authorized by: Arnaldo Natal   Consent:    Consent obtained:  Verbal   Consent given by:  Patient   Risks discussed:  Pain Anesthesia (see MAR for exact dosages):    Anesthesia method:  Local infiltration   Local anesthetic:  Lidocaine 1% w/o epi Laceration details:    Location:  Finger   Finger location:  L thumb   Length (cm):  3 Pre-procedure details:    Preparation:  Patient was prepped and draped in usual sterile fashion Exploration:    Hemostasis achieved with:  Direct pressure   Wound extent: no nerve damage noted and no tendon damage noted   Treatment:    Area cleansed with:  Betadine   Amount of cleaning:  Standard   Irrigation solution:  Sterile saline Skin repair:    Repair method:  Sutures    Suture size:  3-0   Suture material:  Nylon   Number of sutures:  5 Approximation:    Approximation:  Close   Vermilion border: well-aligned   Post-procedure details:    Dressing:  Non-adherent dressing   (including critical care time)  Medications Ordered in UC Medications  tetanus & diphtheria toxoids (adult) (TENIVAC) injection 0.5 mL (not administered)  Tdap (BOOSTRIX) injection 0.5 mL (not administered)     Initial Impression / Assessment and Plan / UC Course  I have reviewed the triage vital signs and the nursing notes.  Pertinent labs & imaging results that were available during my care of the patient were reviewed by me and considered in my medical decision making (see chart for details).     Simple sutures placed x 5 without difficulty. Pressure dressing placed and wound care discussed. FU in 5-7 days for suture removal. Td also updated today.   Final Clinical Impressions(s) / UC Diagnoses   Final diagnoses:  Laceration of left thumb without foreign body without damage to nail, initial encounter  Essential hypertension    New Prescriptions New Prescriptions   No medications on file     Controlled Substance Prescriptions [place] Controlled Substance Registry consulted? Not Applicable   Riki Sheer, PA-C 04/04/17 1411

## 2021-12-18 ENCOUNTER — Emergency Department (HOSPITAL_BASED_OUTPATIENT_CLINIC_OR_DEPARTMENT_OTHER): Payer: BLUE CROSS/BLUE SHIELD | Admitting: Radiology

## 2021-12-18 ENCOUNTER — Emergency Department (HOSPITAL_BASED_OUTPATIENT_CLINIC_OR_DEPARTMENT_OTHER)
Admission: EM | Admit: 2021-12-18 | Discharge: 2021-12-18 | Disposition: A | Discharge status: Home / Self Care | Payer: BLUE CROSS/BLUE SHIELD | Attending: Emergency Medicine | Admitting: Emergency Medicine

## 2021-12-18 ENCOUNTER — Other Ambulatory Visit: Payer: Self-pay

## 2021-12-18 ENCOUNTER — Encounter (HOSPITAL_BASED_OUTPATIENT_CLINIC_OR_DEPARTMENT_OTHER): Payer: Self-pay

## 2021-12-18 DIAGNOSIS — I160 Hypertensive urgency: Secondary | ICD-10-CM | POA: Insufficient documentation

## 2021-12-18 DIAGNOSIS — F17200 Nicotine dependence, unspecified, uncomplicated: Secondary | ICD-10-CM | POA: Diagnosis not present

## 2021-12-18 DIAGNOSIS — H538 Other visual disturbances: Secondary | ICD-10-CM | POA: Insufficient documentation

## 2021-12-18 DIAGNOSIS — R0789 Other chest pain: Secondary | ICD-10-CM | POA: Diagnosis not present

## 2021-12-18 DIAGNOSIS — Z79899 Other long term (current) drug therapy: Secondary | ICD-10-CM | POA: Insufficient documentation

## 2021-12-18 DIAGNOSIS — I10 Essential (primary) hypertension: Secondary | ICD-10-CM | POA: Diagnosis present

## 2021-12-18 DIAGNOSIS — R519 Headache, unspecified: Secondary | ICD-10-CM | POA: Diagnosis not present

## 2021-12-18 LAB — CBC
HCT: 33.7 % — ABNORMAL LOW (ref 36.0–46.0)
Hemoglobin: 10.8 g/dL — ABNORMAL LOW (ref 12.0–15.0)
MCH: 25.7 pg — ABNORMAL LOW (ref 26.0–34.0)
MCHC: 32 g/dL (ref 30.0–36.0)
MCV: 80.2 fL (ref 80.0–100.0)
Platelets: 297 10*3/uL (ref 150–400)
RBC: 4.2 MIL/uL (ref 3.87–5.11)
RDW: 16.2 % — ABNORMAL HIGH (ref 11.5–15.5)
WBC: 6.2 10*3/uL (ref 4.0–10.5)
nRBC: 0 % (ref 0.0–0.2)

## 2021-12-18 LAB — BASIC METABOLIC PANEL
Anion gap: 12 (ref 5–15)
BUN: 13 mg/dL (ref 6–20)
CO2: 22 mmol/L (ref 22–32)
Calcium: 9.4 mg/dL (ref 8.9–10.3)
Chloride: 103 mmol/L (ref 98–111)
Creatinine, Ser: 0.88 mg/dL (ref 0.44–1.00)
GFR, Estimated: >60 mL/min (ref >60)
Glucose, Bld: 115 mg/dL — ABNORMAL HIGH (ref 70–99)
Potassium: 3.4 mmol/L — ABNORMAL LOW (ref 3.5–5.1)
Sodium: 137 mmol/L (ref 135–145)

## 2021-12-18 LAB — TROPONIN I (HIGH SENSITIVITY): Troponin I (High Sensitivity): 9 ng/L (ref <18)

## 2021-12-18 LAB — PREGNANCY, URINE: Preg Test, Ur: NEGATIVE

## 2021-12-18 MED ORDER — HYDRALAZINE HCL 10 MG PO TABS: 10.0000 mg | ORAL_TABLET | Freq: Three times a day (TID) | ORAL | 0 refills | Status: AC

## 2021-12-18 MED ORDER — NITROPRUSSIDE SODIUM-NACL 20-0.9 MG/100ML-% IV SOLN
0.0000 ug/kg/min | INTRAVENOUS | Status: DC
  Administered 2021-12-18: 0.3 ug/kg/min via INTRAVENOUS
  Filled 2021-12-18: qty 100

## 2021-12-18 MED ORDER — HYDRALAZINE HCL 20 MG/ML IJ SOLN
10.0000 mg | Freq: Once | INTRAMUSCULAR | Status: AC
  Administered 2021-12-18: 10 mg via INTRAVENOUS
  Filled 2021-12-18: qty 1

## 2021-12-18 MED ORDER — METOPROLOL TARTRATE 5 MG/5ML IV SOLN
5.0000 mg | Freq: Once | INTRAVENOUS | Status: AC
  Administered 2021-12-18: 5 mg via INTRAVENOUS
  Filled 2021-12-18: qty 5

## 2021-12-18 NOTE — ED Triage Notes (Signed)
Patient has been off here BP med since 2020. Was in to see new PCP to reestablish care. Was told BP was high 218/136, and had a new murmur on exam. Ekg findings. Patient endorses chest pain dizziness and lightheadedness intermittently.

## 2021-12-18 NOTE — Discharge Instructions (Signed)
Please take your lisinopril-hydrochlorothiazide medications that were prescribed by your primary care physician daily.  Please also start taking hydralazine 10 mg tablets every 8 hours (3 times a day).  Follow-up with your new primary care physician in 2 weeks for repeat blood pressure checks.

## 2021-12-18 NOTE — ED Notes (Signed)
Patient aware of urine sample but does not need to go at this time.

## 2021-12-18 NOTE — ED Provider Notes (Signed)
[facility] EMERGENCY DEPT Provider Note   CSN: [id number] Arrival date & time: 12/18/21  1204     History {Add pertinent medical, surgical, social history, OB history to HPI:1} Chief Complaint  Patient presents with  . Hypertension    Enza D Canlas is a 41 y.o. female.  Patient is a 41 year old female with past medical history of hypertension and previous tobacco use presenting for complaints of hypertension.  Patient was on blood pressure medications however stopped in 2020 after insurance loss.  Patient admits to chest tightness, blurred vision, and headache that occurred this morning.  Was following up with her primary care physician who did a new cardiac murmur and had blood pressure of 224/46.  On arrival but patient's blood pressure 220/146.  Denies any symptoms at this time.  Patient has not tried any dietary modifications including low-sodium or low caffeine diet.  The history is provided by the patient. No language interpreter was used.  Hypertension Pertinent negatives include no chest pain, no abdominal pain and no shortness of breath.       Home Medications Prior to Admission medications   Medication Sig Start Date End Date Taking? Authorizing Provider  doxycycline (VIBRAMYCIN) 100 MG capsule Take 1 capsule (100 mg total) by mouth 2 (two) times daily. 09/29/15   Muthersbaugh, Dahlia Client, PA-C  hydrochlorothiazide (HYDRODIURIL) 12.5 MG tablet Take 1 tablet (12.5 mg total) by mouth daily. 09/29/15   Muthersbaugh, Dahlia Client, PA-C  lisinopril-hydrochlorothiazide (ZESTORETIC) 20-25 MG tablet Take 1 tablet by mouth daily. 12/18/21   [provider]  loratadine (CLARITIN) 10 MG tablet Take 10 mg by mouth daily.    [provider]      Allergies    Penicillins and Shellfish allergy    Review of Systems   Review of Systems  Constitutional:  Negative for chills and fever.  HENT:  Negative for ear pain and sore throat.   Eyes:  Negative for pain and  visual disturbance.  Respiratory:  Negative for cough and shortness of breath.   Cardiovascular:  Negative for chest pain and palpitations.  Gastrointestinal:  Negative for abdominal pain and vomiting.  Genitourinary:  Negative for dysuria and hematuria.  Musculoskeletal:  Negative for arthralgias and back pain.  Skin:  Negative for color change and rash.  Neurological:  Negative for seizures and syncope.  All other systems reviewed and are negative.   Physical Exam Updated Vital Signs BP (!) 220/146   Pulse 86   Temp 98.2 F (36.8 C)   Resp 19   Ht 5\' 5"  (1.651 m)   Wt 75.8 kg   SpO2 99%   BMI 27.79 kg/m  Physical Exam Vitals and nursing note reviewed.  Constitutional:      General: She is not in acute distress.    Appearance: She is well-developed.  HENT:     Head: Normocephalic and atraumatic.  Eyes:     Conjunctiva/sclera: Conjunctivae normal.  Cardiovascular:     Rate and Rhythm: Normal rate and regular rhythm.     Heart sounds: No murmur heard. Pulmonary:     Effort: Pulmonary effort is normal. No respiratory distress.     Breath sounds: Normal breath sounds.  Abdominal:     Palpations: Abdomen is soft.     Tenderness: There is no abdominal tenderness.  Musculoskeletal:        General: No swelling.     Cervical back: Neck supple.     Right lower leg: No edema.  Left lower leg: No edema.  Skin:    General: Skin is warm and dry.     Capillary Refill: Capillary refill takes less than 2 seconds.  Neurological:     Mental Status: She is alert.  Psychiatric:        Mood and Affect: Mood normal.    ED Results / Procedures / Treatments   Labs (all labs ordered are listed, but only abnormal results are displayed) Labs Reviewed  CBC - Abnormal; Notable for the following components:      Result Value   Hemoglobin 10.8 (*)    HCT 33.7 (*)    MCH 25.7 (*)    RDW 16.2 (*)    All other components within normal limits  BASIC METABOLIC PANEL  PREGNANCY,  URINE  TROPONIN I (HIGH SENSITIVITY)    EKG None  Radiology DG Chest 2 View  Result Date: 12/18/2021 CLINICAL DATA:  Chest pain EXAM: CHEST - 2 VIEW COMPARISON:  Chest x-ray 10/03/2011 FINDINGS: Heart size and mediastinal contours are within normal limits. No suspicious pulmonary opacities identified. No pleural effusion or pneumothorax visualized. No acute osseous abnormality appreciated. IMPRESSION: No acute intrathoracic process identified. Electronically Signed   By: Ofilia Neas M.D.   On: 12/18/2021 12:53    Procedures Procedures  {Document cardiac monitor, telemetry assessment procedure when appropriate:1}  Medications Ordered in ED Medications - No data to display  ED Course/ Medical Decision Making/ A&P                           Medical Decision Making Amount and/or Complexity of Data Reviewed Labs: ordered. Radiology: ordered.  Risk Prescription drug management.   4:53 PM  41 year old female with past medical history of hypertension and previous tobacco use presenting for complaints of hypertension.  Patient is alert and oriented x3, no acute distress, afebrile, stable vital signs.  Physical exam demonstrates no neurological dysfunction.  Clear breath sounds.  No lower extremity swelling.  EKG demonstrates sinus rhythm with a rate of 81 bpm.  LVH seen.  No ST segment elevation or depression.  T wave inversions in inferior lateral leads.  Stable electrolytes, troponin, and chest x-ray.  No acute myocardial infarction.  No evidence of pulmonary embolism.  Stable renal function.  Patient given Lopressor IV twice with minimal improvement of blood pressure.  Blood pressure still over [id number] systolic.  I spoke with on-call cardiology team who recommends attempting hydralazine 10 mg IV prior to initiating nicardipine drip. {Document critical care time when appropriate:1} {Document review of labs and clinical decision tools ie heart score, Chads2Vasc2 etc:1}  {Document your  independent review of radiology images, and any outside records:1} {Document your discussion with family members, caretakers, and with consultants:1} {Document social determinants of health affecting pt's care:1} {Document your decision making why or why not admission, treatments were needed:1} Final Clinical Impression(s) / ED Diagnoses Final diagnoses:  None    Rx / DC Orders ED Discharge Orders     None

## 2021-12-30 LAB — ALDOSTERONE + RENIN ACTIVITY W/ RATIO
ALDO / PRA Ratio: <0.6 (ref 0.0–30.0)
Aldosterone: <1 ng/dL (ref 0.0–30.0)
PRA LC/MS/MS: 1.544 ng/mL/hr (ref 0.167–5.380)

## 2022-09-03 IMAGING — DX DG CHEST 2V
2 series · 2 of 2 positions shown · non-contrast
Comparison: Chest x-ray 10/03/2011

CLINICAL DATA: Chest pain

EXAM:
CHEST - 2 VIEW

[chest pa]
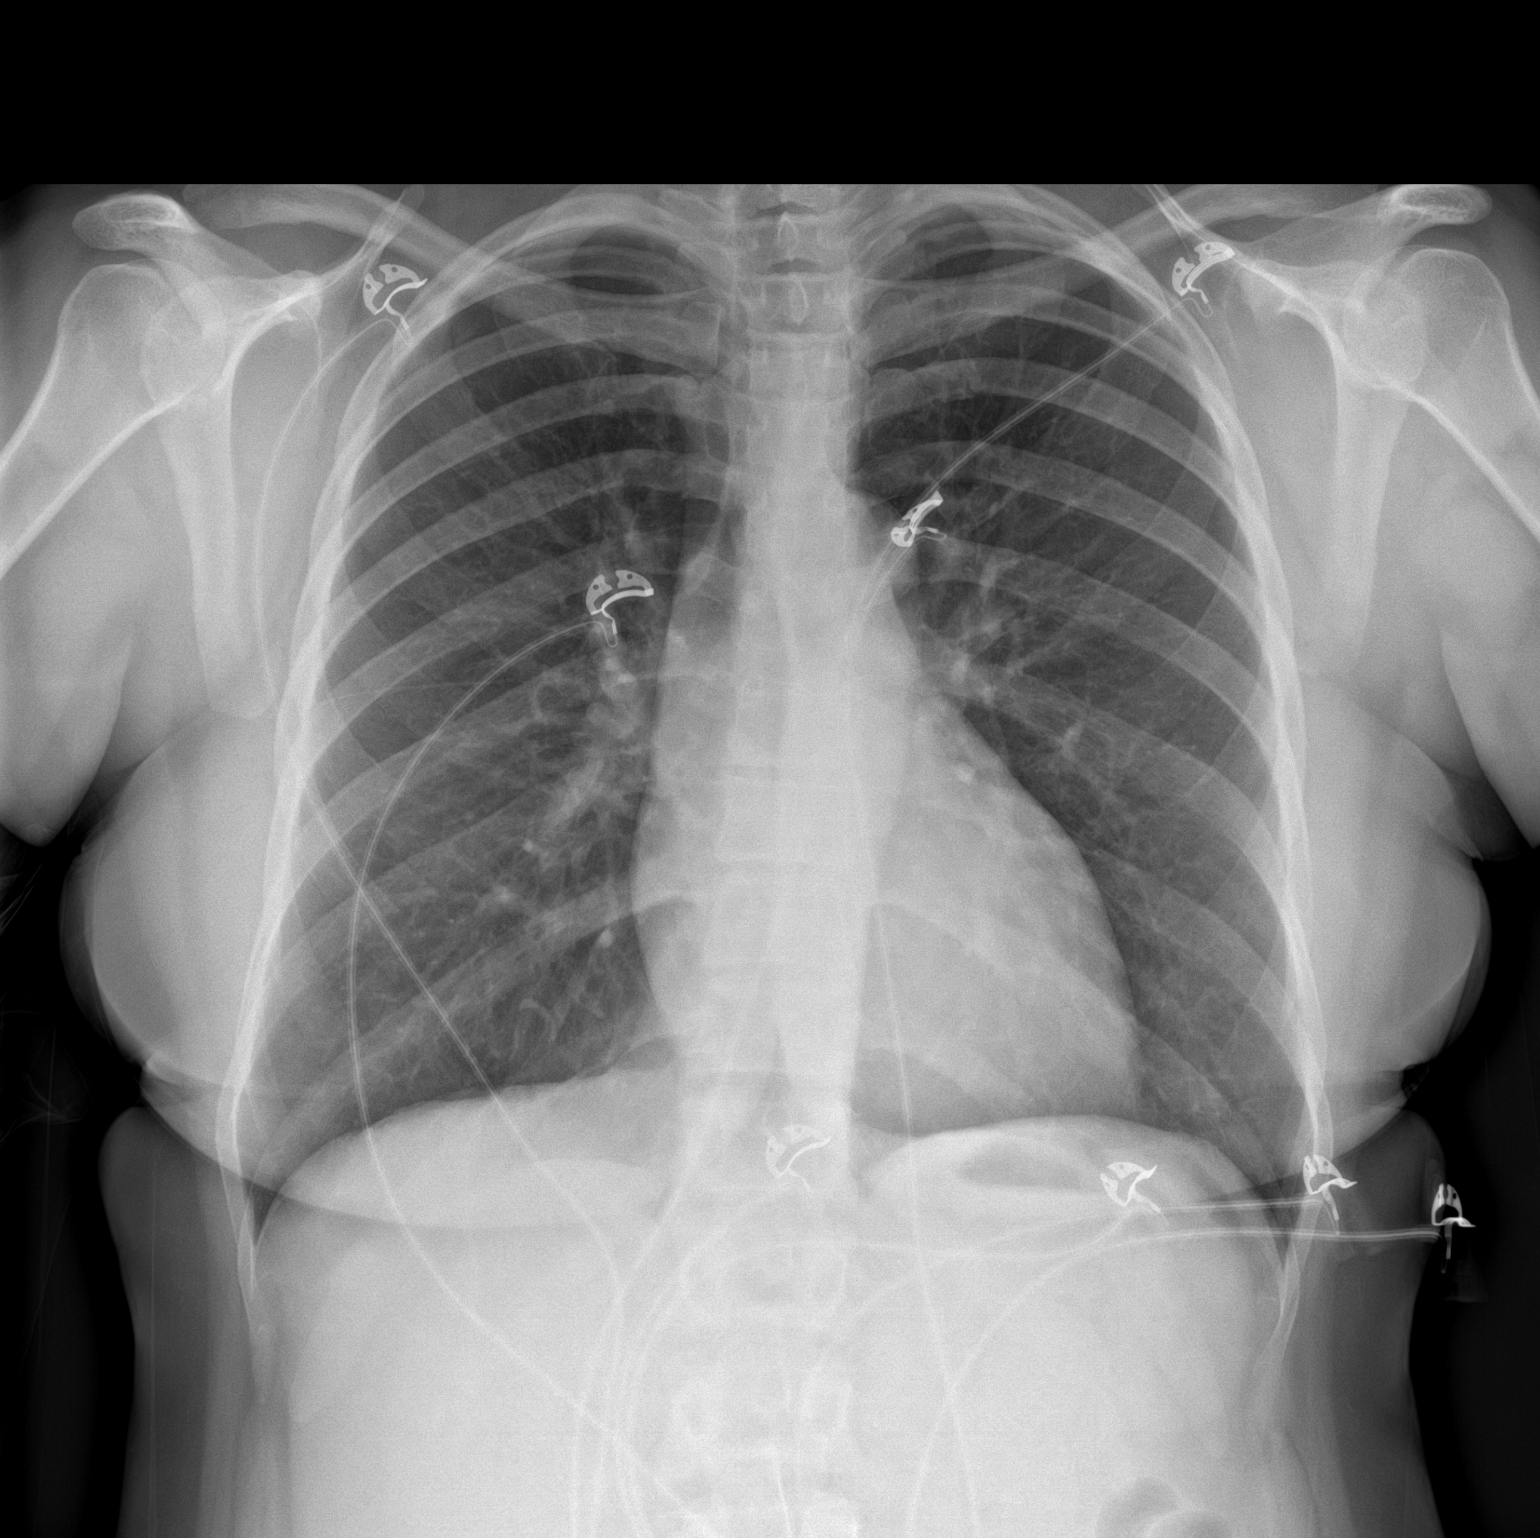

[chest lat]
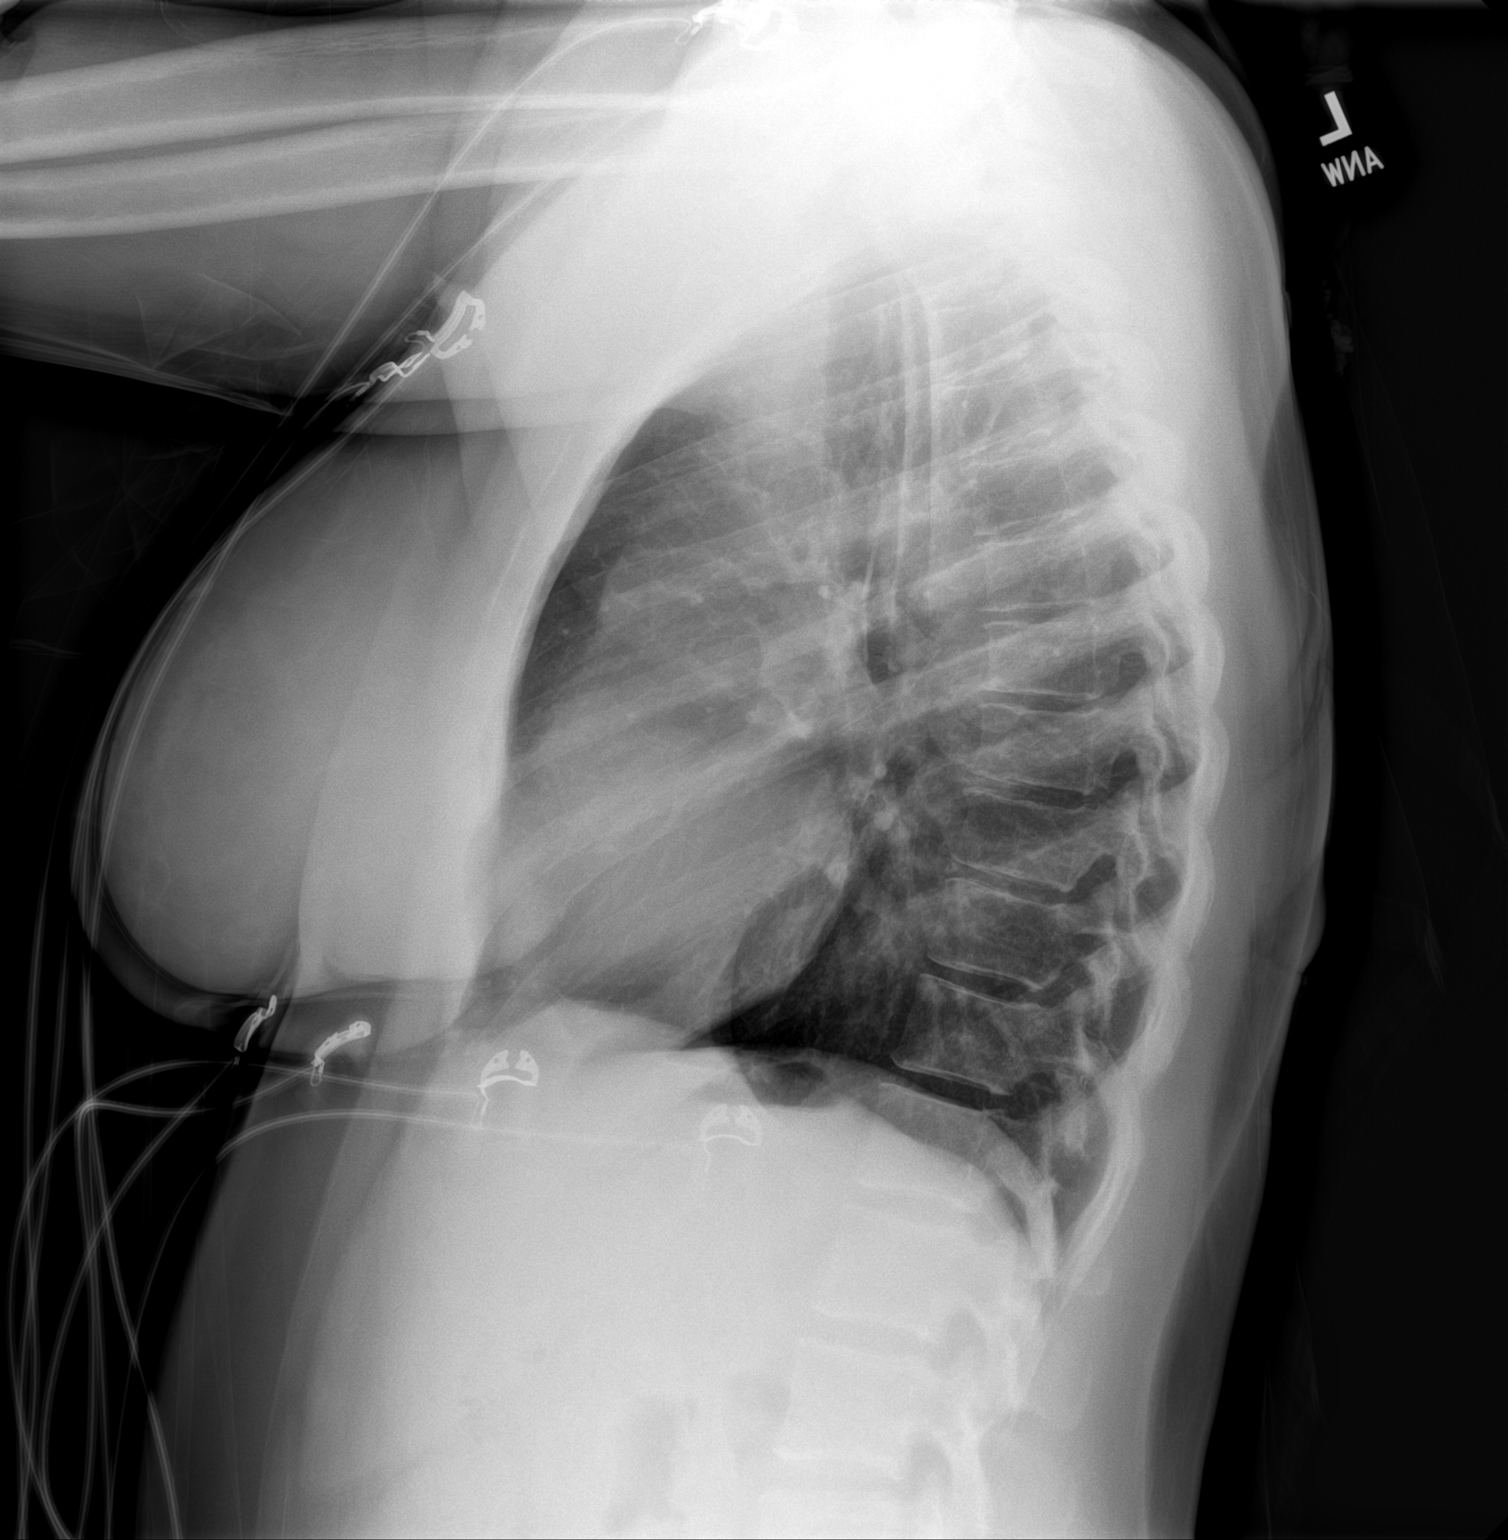

[2 of 2 positions shown; findings below may reference images not displayed]

FINDINGS: Heart size and mediastinal contours are within normal limits. No
suspicious pulmonary opacities identified.

No pleural effusion or pneumothorax visualized.

No acute osseous abnormality appreciated.
IMPRESSION: No acute intrathoracic process identified.
